# Patient Record
Sex: Male | Born: 1990 | Race: Black or African American | Hispanic: No | Marital: Single | Smoking: Never smoker
Health system: Southern US, Community
[De-identification: ages and names within clinical notes are randomized; demographics above are authoritative.]

## PROBLEM LIST (undated history)

## (undated) DIAGNOSIS — J45909 Unspecified asthma, uncomplicated: Secondary | ICD-10-CM

## (undated) DIAGNOSIS — U071 COVID-19: Secondary | ICD-10-CM

## (undated) HISTORY — PX: OTHER SURGICAL HISTORY: SHX169

---
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
## 2011-01-15 ENCOUNTER — Emergency Department: Payer: Self-pay | Admitting: Emergency Medicine

## 2011-11-25 ENCOUNTER — Emergency Department: Payer: Self-pay | Admitting: Emergency Medicine

## 2014-10-13 ENCOUNTER — Encounter: Payer: Self-pay | Admitting: Emergency Medicine

## 2014-10-13 ENCOUNTER — Emergency Department
Admission: EM | Admit: 2014-10-13 | Discharge: 2014-10-13 | Disposition: A | Discharge status: Home / Self Care | Payer: Self-pay | Attending: Emergency Medicine | Admitting: Emergency Medicine

## 2014-10-13 DIAGNOSIS — K4090 Unilateral inguinal hernia, without obstruction or gangrene, not specified as recurrent: Secondary | ICD-10-CM | POA: Insufficient documentation

## 2014-10-13 DIAGNOSIS — X58XXXA Exposure to other specified factors, initial encounter: Secondary | ICD-10-CM | POA: Insufficient documentation

## 2014-10-13 DIAGNOSIS — Y9389 Activity, other specified: Secondary | ICD-10-CM | POA: Insufficient documentation

## 2014-10-13 DIAGNOSIS — S0501XA Injury of conjunctiva and corneal abrasion without foreign body, right eye, initial encounter: Secondary | ICD-10-CM | POA: Insufficient documentation

## 2014-10-13 DIAGNOSIS — Y998 Other external cause status: Secondary | ICD-10-CM | POA: Insufficient documentation

## 2014-10-13 DIAGNOSIS — H109 Unspecified conjunctivitis: Secondary | ICD-10-CM | POA: Insufficient documentation

## 2014-10-13 DIAGNOSIS — Y9289 Other specified places as the place of occurrence of the external cause: Secondary | ICD-10-CM | POA: Insufficient documentation

## 2014-10-13 MED ORDER — ERYTHROMYCIN 5 MG/GM OP OINT: 1.0000 "application " | TOPICAL_OINTMENT | Freq: Four times a day (QID) | OPHTHALMIC | Status: DC

## 2014-10-13 MED ORDER — FLUORESCEIN SODIUM 1 MG OP STRP
1.0000 | ORAL_STRIP | Freq: Once | OPHTHALMIC | Status: AC
  Administered 2014-10-13: 1 via OPHTHALMIC
  Filled 2014-10-13: qty 1

## 2014-10-13 MED ORDER — TETRACAINE HCL 0.5 % OP SOLN
2.0000 [drp] | Freq: Once | OPHTHALMIC | Status: AC
  Administered 2014-10-13: 2 [drp] via OPHTHALMIC
  Filled 2014-10-13: qty 2

## 2014-10-13 NOTE — ED Notes (Signed)
Pt presents with right eye redness and wants to have his hernia checked out.

## 2014-10-13 NOTE — ED Provider Notes (Signed)
[facility] Emergency Department Provider Note  ____________________________________________  Time seen: Approximately 4:52 PM  I have reviewed the triage vital signs and the nursing notes.   HISTORY  Chief Complaint Eye Pain and Hernia   HPI Kurt Barnett is a 24 y.o. male presents to the ER for complaints of right eye redness and irritation. Patient states it feels like something is in his right eye. Patient states that he did not get anything in his right eye, denies foreign bodies or chemicals. Patient denies sick contacts. Patient reports he does not wear glasses or contacts. Patient states that his eye started out as mildly reddened and itchy. Patient states that he then started rubbing and scratching his eyes over several days and then it started to feel like something was in his eye. Patient states that the right eye slightly sensitive to light. Denies vision changes. Reports that his eye has been intermittently matted shut with greenish discharge. Patient states that it is a burning discomfort. Denies pain in the eye. Denies other complaints. Denies other recent sickness.  She also reports that while he is here he would like to have his left scrotal hernia checked out. Patient states that he is not having any pain in the area and he reports that it is the same as it always has been. Patient reports that he was seen in the ER approximately one year ago for the same. Patient reports that the swelling comes and goes intermittently over the last 2 years. Patient reports again reports that the swelling is unchanged. Patient also reports that he is in no pain at this time. Denies urinary changes or bowel changes. Denies abdominal pain.Reports he was told to follow up with surgery but "just haven't yet". Denies fever.    History reviewed. No pertinent past medical history.  There are no active problems to display for this patient.   History reviewed. No pertinent  past surgical history.  No current outpatient prescriptions on file.  Allergies Review of patient's allergies indicates no known allergies.  No family history on file.  Social History History  Substance Use Topics  . Smoking status: Never Smoker   . Smokeless tobacco: Not on file  . Alcohol Use: No    Review of Systems Constitutional: No fever/chills Eyes: No visual changes. Right eye discomfort.  ENT: No sore throat. Cardiovascular: Denies chest pain. Respiratory: Denies shortness of breath. Gastrointestinal: No abdominal pain.  No nausea, no vomiting.  No diarrhea.  No constipation. Genitourinary: Negative for dysuria. Musculoskeletal: Negative for back pain. Skin: Negative for rash. Neurological: Negative for headaches, focal weakness or numbness.  10-point ROS otherwise negative.  ____________________________________________   PHYSICAL EXAM:  VITAL SIGNS: ED Triage Vitals  Enc Vitals Group     BP 10/13/14 1555 150/52 mmHg     Pulse Rate 10/13/14 1555 107     Resp 10/13/14 1555 18     Temp 10/13/14 1555 98.6 F (37 C)     Temp Source 10/13/14 1555 Oral     SpO2 10/13/14 1555 94 %     Weight 10/13/14 1555 387 lb (175.542 kg)     Height 10/13/14 1555  (1.803 m)     Head Cir --      Peak Flow --      Pain Score --      Pain Loc --      Pain Edu? --      Excl. in GC? --   Blood pressure  122/50, pulse 80, temperature 98.6 F (37 C), temperature source Oral, resp. rate 16, height 5\' 11"  (1.803 m), weight 387 lb (175.542 kg), SpO2 99 %.   Constitutional: Alert and oriented. Well appearing and in no acute distress. Eyes:PERRL. EOMI. Left conjunctiva normal. Right conjunctiva mild to mod injection. No drainage or exudate. Non tender. No swelling or surrounding swelling. NO ecchymosis. Anterior chambers normal as examined.  Head: Atraumatic. Nose: No congestion/rhinnorhea. Ears: no erythema, normal TMs.  Mouth/Throat: Mucous membranes are moist.   Oropharynx non-erythematous. Neck: No stridor.  No cervical spine tenderness to palpation. Hematological/Lymphatic/Immunilogical: No cervical lymphadenopathy. Cardiovascular: Normal rate, regular rhythm. Grossly normal heart sounds.  Good peripheral circulation. Respiratory: Normal respiratory effort.  No retractions. Lungs CTAB. Genitourinary: With [employer] student Mikayla at bedside.  No penile, testicular or inguinal area tenderness. No erythema. No lesions. Left scrotum mildly larger than right, nontender. No hernia palpated. Examined in flat and standing position. Scrotum nontender. Normal coloration bilateral.  Gastrointestinal: Soft and nontender. No distention. No abdominal bruits. No CVA tenderness.normal bowel sounds.  Musculoskeletal: No lower extremity tenderness nor edema.  No joint effusions. Neurologic:  Normal speech and language. No gross focal neurologic deficits are appreciated. No gait instability. Skin:  Skin is warm, dry and intact. No rash noted. Psychiatric: Mood and affect are normal. Speech and behavior are normal.  _Bilateral Distance: 20/70 ; R Distance: 20/70 ; L Distance: 20/40___________________________________________   LABS (all labs ordered are listed, but only abnormal results are displayed)  Labs Reviewed - No data to display ________________________________________   PROCEDURES  Procedure(s) performed: Procedure explained and verbal consent obtained. Right eye examined with your fluroscein. Tetracaine ophthalmic drops used as anesthesia. 2 drops right eye. Patient tolerated well. Patient with right eye corneal abrasion at 6:00. Patient tolerated procedure well.     ____________________________________________   INITIAL IMPRESSION / ASSESSMENT AND PLAN / ED COURSE  Pertinent labs & imaging results that were available during my care of the patient were reviewed by me and considered in my medical decision making (see chart for details).  Very  well appearing. No acute distress. Presents to ER for right eye redness and irritation. Right eye injected and evaluated by fluorescein. Right eye with corneal abrasion. We'll treat with erythromycin ophthalmic ointment. Patient follow-up with ophthalmology in 3 days. Patient verbalized understanding.  Patient also requests to have left scrotal hernia "checked". Left testicle mildly larger than right, nontender. No palpable hernia. NO signs of infection. Unchanged per patient x one year. Discussed in detail with patient to follow up with surgery as previously directed. Per patient this is a chronic condition and unchanged from baseline. Patient to follow up with surgery as previously directed without further evaluation in ER at this time as unchanged from chronic. Discussed strict return parameters. Patient verbalized understanding and agreed to plan.  Encouraged patient established primary care physician for regular follow-up. ____________________________________________   FINAL CLINICAL IMPRESSION(S) / ED DIAGNOSES  Final diagnoses:  Corneal abrasion, right, initial encounter  Conjunctivitis of right eye  Scrotal hernia, left chronic       Renford DillsLindsey Wynona Duhamel, NP 10/13/14 1814  Loleta Roseory Forbach, MD 10/14/14 0004

## 2014-10-13 NOTE — Discharge Instructions (Signed)
Use medication as prescribed. Avoid rubbing and scratching eye. Keep hands clean.  Follow-up with ophthalmology in 3 days. Follow-up with surgery this week regarding your chronic hernia. See above to call tomorrow to schedule. Return to the ER for new or worsening concerns. Return to the ER for scrotal pain, increased swelling, increased pain, abnormal coloration, new or worsening concerns.  Corneal Abrasion The cornea is the clear covering at the front and center of the eye. When looking at the colored portion of the eye (iris), you are looking through the cornea. This very thin tissue is made up of many layers. The surface layer is a single layer of cells (corneal epithelium) and is one of the most sensitive tissues in the body. If a scratch or injury causes the corneal epithelium to come off, it is called a corneal abrasion. If the injury extends to the tissues below the epithelium, the condition is called a corneal ulcer. CAUSES   Scratches.  Trauma.  Foreign body in the eye. Some people have recurrences of abrasions in the area of the original injury even after it has healed (recurrent erosion syndrome). Recurrent erosion syndrome generally improves and goes away with time. SYMPTOMS   Eye pain.  Difficulty or inability to keep the injured eye open.  The eye becomes very sensitive to light.  Recurrent erosions tend to happen suddenly, first thing in the morning, usually after waking up and opening the eye. DIAGNOSIS  Your health care provider can diagnose a corneal abrasion during an eye exam. Dye is usually placed in the eye using a drop or a small paper strip moistened by your tears. When the eye is examined with a special light, the abrasion shows up clearly because of the dye. TREATMENT   Small abrasions may be treated with antibiotic drops or ointment alone.  A pressure patch may be put over the eye. If this is done, follow your doctor's instructions for when to remove the  patch. Do not drive or use machines while the eye patch is on. Judging distances is hard to do with a patch on. If the abrasion becomes infected and spreads to the deeper tissues of the cornea, a corneal ulcer can result. This is serious because it can cause corneal scarring. Corneal scars interfere with light passing through the cornea and cause a loss of vision in the involved eye. HOME CARE INSTRUCTIONS  Use medicine or ointment as directed. Only take over-the-counter or prescription medicines for pain, discomfort, or fever as directed by your health care provider.  Do not drive or operate machinery if your eye is patched. Your ability to judge distances is impaired.  If your health care provider has given you a follow-up appointment, it is very important to keep that appointment. Not keeping the appointment could result in a severe eye infection or permanent loss of vision. If there is any problem keeping the appointment, let your health care provider know. SEEK MEDICAL CARE IF:   You have pain, light sensitivity, and a scratchy feeling in one eye or both eyes.  Your pressure patch keeps loosening up, and you can blink your eye under the patch after treatment.  Any kind of discharge develops from the eye after treatment or if the lids stick together in the morning.  You have the same symptoms in the morning as you did with the original abrasion days, weeks, or months after the abrasion healed. MAKE SURE YOU:   Understand these instructions.  Will watch your condition.  Will get help right away if you are not doing well or get worse. Document Released: 03/11/2000 Document Revised: 03/19/2013 Document Reviewed: 11/19/2012 [facility] Patient Information 2015 [place], [place]. This information is not intended to replace advice given to you by your health care provider. Make sure you discuss any questions you have with your health care provider.  Conjunctivitis Conjunctivitis is commonly  called "pink eye." Conjunctivitis can be caused by bacterial or viral infection, allergies, or injuries. There is usually redness of the lining of the eye, itching, discomfort, and sometimes discharge. There may be deposits of matter along the eyelids. A viral infection usually causes a watery discharge, while a bacterial infection causes a yellowish, thick discharge. Pink eye is very contagious and spreads by direct contact. You may be given antibiotic eyedrops as part of your treatment. Before using your eye medicine, remove all drainage from the eye by washing gently with warm water and cotton balls. Continue to use the medication until you have awakened 2 mornings in a row without discharge from the eye. Do not rub your eye. This increases the irritation and helps spread infection. Use separate towels from other household members. Wash your hands with soap and water before and after touching your eyes. Use cold compresses to reduce pain and sunglasses to relieve irritation from light. Do not wear contact lenses or wear eye makeup until the infection is gone. SEEK MEDICAL CARE IF:   Your symptoms are not better after 3 days of treatment.  You have increased pain or trouble seeing.  The outer eyelids become very red or swollen. Document Released: 04/21/2004 Document Revised: 06/06/2011 Document Reviewed: 03/14/2005 [facility] Patient Information 2015 [place], [place]. This information is not intended to replace advice given to you by your health care provider. Make sure you discuss any questions you have with your health care provider.

## 2014-10-22 ENCOUNTER — Ambulatory Visit: Payer: Self-pay | Admitting: Surgery

## 2015-06-10 ENCOUNTER — Ambulatory Visit: Payer: Self-pay | Admitting: Surgery

## 2017-04-16 ENCOUNTER — Emergency Department
Admission: EM | Admit: 2017-04-16 | Discharge: 2017-04-16 | Disposition: A | Discharge status: Home / Self Care | Payer: Self-pay | Attending: Emergency Medicine | Admitting: Emergency Medicine

## 2017-04-16 ENCOUNTER — Encounter: Payer: Self-pay | Admitting: Emergency Medicine

## 2017-04-16 ENCOUNTER — Other Ambulatory Visit: Payer: Self-pay

## 2017-04-16 DIAGNOSIS — W228XXA Striking against or struck by other objects, initial encounter: Secondary | ICD-10-CM | POA: Insufficient documentation

## 2017-04-16 DIAGNOSIS — S0502XA Injury of conjunctiva and corneal abrasion without foreign body, left eye, initial encounter: Secondary | ICD-10-CM | POA: Insufficient documentation

## 2017-04-16 DIAGNOSIS — Y939 Activity, unspecified: Secondary | ICD-10-CM | POA: Insufficient documentation

## 2017-04-16 DIAGNOSIS — Y92511 Restaurant or cafe as the place of occurrence of the external cause: Secondary | ICD-10-CM | POA: Insufficient documentation

## 2017-04-16 DIAGNOSIS — Y999 Unspecified external cause status: Secondary | ICD-10-CM | POA: Insufficient documentation

## 2017-04-16 MED ORDER — FLUORESCEIN SODIUM 1 MG OP STRP
1.0000 | ORAL_STRIP | Freq: Once | OPHTHALMIC | Status: AC
  Administered 2017-04-16: 1 via OPHTHALMIC
  Filled 2017-04-16: qty 1

## 2017-04-16 MED ORDER — TETRACAINE HCL 0.5 % OP SOLN
1.0000 [drp] | Freq: Once | OPHTHALMIC | Status: AC
  Administered 2017-04-16: 1 [drp] via OPHTHALMIC

## 2017-04-16 MED ORDER — POLYMYXIN B-TRIMETHOPRIM 10000-0.1 UNIT/ML-% OP SOLN: 1.0000 [drp] | OPHTHALMIC | 0 refills | Status: AC

## 2017-04-16 MED ORDER — DICLOFENAC SODIUM 0.1 % OP SOLN: 1.0000 [drp] | Freq: Four times a day (QID) | OPHTHALMIC | 0 refills | Status: AC

## 2017-04-16 MED ORDER — TETRACAINE HCL 0.5 % OP SOLN
OPHTHALMIC | Status: AC
  Administered 2017-04-16: 1 [drp] via OPHTHALMIC
  Filled 2017-04-16: qty 4

## 2017-04-16 NOTE — ED Triage Notes (Signed)
C/O left eye pain -- states he was poked in left eye with a key chain.  Injury occurred last night.

## 2017-04-16 NOTE — ED Provider Notes (Signed)
[facility] Emergency Department Provider Note  ____________________________________________  Time seen: Approximately 7:00 PM  I have reviewed the triage vital signs and the nursing notes.   HISTORY  Chief Complaint Eye Injury    HPI Kurt Barnett is a 27 y.o. male presents to the emergency department with left eye pain after patient reports that he was [place]chain in the [place].  Patient has had some blurry vision, increased tearing and photophobia.  Patient was not wearing contact lenses at the time.  He denies pain with extraocular eye muscle movement.  No nausea or vomiting.  No alleviating measures have been attempted.   History reviewed. No pertinent past medical history.  There are no active problems to display for this patient.   History reviewed. No pertinent surgical history.  Prior to Admission medications   Medication Sig Start Date End Date Taking? Authorizing Provider  diclofenac (VOLTAREN) 0.1 % ophthalmic solution Place 1 drop into the left eye 4 (four) times daily for 7 days. 04/16/17 04/23/17  Orvil Feil, PA-C  erythromycin ophthalmic ointment Place 1 application into the right eye 4 (four) times daily. For five days 10/13/14   Renford Dills, NP  trimethoprim-polymyxin b (POLYTRIM) ophthalmic solution Place 1 drop into the left eye every 4 (four) hours for 10 days. 04/16/17 04/26/17  Orvil Feil, PA-C    Allergies Patient has no known allergies.  No family history on file.  Social History Social History   Tobacco Use  . Smoking status: Never Smoker  . Smokeless tobacco: Never Used  Substance Use Topics  . Alcohol use: No  . Drug use: Not on file     Review of Systems  Constitutional: No fever/chills Eyes: Patient has left eye pain.  ENT: No upper respiratory complaints. Cardiovascular: no chest pain. Respiratory: no cough. No SOB. Musculoskeletal: Negative for musculoskeletal pain. Skin:  Negative for rash, abrasions, lacerations, ecchymosis. Neurological: Negative for headaches, focal weakness or numbness.   ____________________________________________   PHYSICAL EXAM:  VITAL SIGNS: ED Triage Vitals  Enc Vitals Group     BP 04/16/17 1834 (!) 153/98     Pulse Rate 04/16/17 1834 75     Resp 04/16/17 1834 18     Temp 04/16/17 1834 98.5 F (36.9 C)     Temp Source 04/16/17 1834 Oral     SpO2 04/16/17 1834 99 %     Weight 04/16/17 1819 (!) 320 lb (145.2 kg)     Height 04/16/17 1819 6' (1.829 m)     Head Circumference --      Peak Flow --      Pain Score 04/16/17 1819 10     Pain Loc --      Pain Edu? --      Excl. in GC? --      Constitutional: Alert and oriented. Well appearing and in no acute distress. Eyes: Left bulbar conjunctiva is diffusely injected with evidence of chemosis. Pupils are equal round and reactive to light bilaterally.  Extraocular eye muscles intact.  No specific uptake visualized with fluorescein staining. Head: Atraumatic. ENT: Cardiovascular: Normal rate, regular rhythm. Normal S1 and S2.  Good peripheral circulation. Respiratory: Normal respiratory effort without tachypnea or retractions. Lungs CTAB. Good air entry to the bases with no decreased or absent breath sounds. Musculoskeletal: Full range of motion to all extremities. No gross deformities appreciated. Neurologic:  Normal speech and language. No gross focal neurologic deficits are appreciated.  Skin:  Skin is warm, dry and intact. No rash noted.  ____________________________________________   LABS (all labs ordered are listed, but only abnormal results are displayed)  Labs Reviewed - No data to display ____________________________________________  EKG   ____________________________________________  RADIOLOGY   No results found.  ____________________________________________    PROCEDURES  Procedure(s) performed:    Procedures  No uptake visualized with  fluorescein staining.   Tonometry: 23, 23, 24  Medications  fluorescein ophthalmic strip 1 strip (not administered)  tetracaine (PONTOCAINE) 0.5 % ophthalmic solution 1 drop (not administered)     ____________________________________________   INITIAL IMPRESSION / ASSESSMENT AND PLAN / ED COURSE  Pertinent labs & imaging results that were available during my care of the patient were reviewed by me and considered in my medical decision making (see chart for details).  Review of the [place] CSRS was performed in accordance of the [facility] prior to dispensing any controlled drugs.     Assessment and plan Left eye pain Differential diagnosis includes corneal abrasion versus a penetrating globe trauma versus allergic chemosis.  No specific region of uptake was visualized with fluorescein staining.  Patient was treated empirically for a small corneal abrasion despite concrete physical exam findings.  Patient was discharged with Polytrim ophthalmic solution and diclofenac ophthalmic solution.  Patient was referred to ophthalmology.  Vital signs were reassuring prior to discharge.  All patient questions were answered.    ____________________________________________  FINAL CLINICAL IMPRESSION(S) / ED DIAGNOSES  Final diagnoses:  Abrasion of left cornea, initial encounter      NEW MEDICATIONS STARTED DURING THIS VISIT:  ED Discharge Orders        Ordered    trimethoprim-polymyxin b (POLYTRIM) ophthalmic solution  Every 4 hours     04/16/17 1851    diclofenac (VOLTAREN) 0.1 % ophthalmic solution  4 times daily     04/16/17 1851          This chart was dictated using voice recognition software/Dragon. Despite best efforts to proofread, errors can occur which can change the meaning. Any change was purely unintentional.    Orvil FeilWoods, Jaclyn M, PA-C 04/16/17 Elwin [id number]    [place], [place], MD 04/19/17 [phone]

## 2017-07-01 ENCOUNTER — Ambulatory Visit (HOSPITAL_COMMUNITY)
Admission: EM | Admit: 2017-07-01 | Discharge: 2017-07-01 | Disposition: A | Discharge status: Home / Self Care | Payer: Self-pay | Attending: Internal Medicine | Admitting: Internal Medicine

## 2017-07-01 ENCOUNTER — Other Ambulatory Visit: Payer: Self-pay

## 2017-07-01 ENCOUNTER — Encounter (HOSPITAL_COMMUNITY): Payer: Self-pay | Admitting: Emergency Medicine

## 2017-07-01 DIAGNOSIS — S0502XA Injury of conjunctiva and corneal abrasion without foreign body, left eye, initial encounter: Secondary | ICD-10-CM

## 2017-07-01 MED ORDER — ERYTHROMYCIN 5 MG/GM OP OINT: 1.0000 "application " | TOPICAL_OINTMENT | Freq: Four times a day (QID) | OPHTHALMIC | 0 refills | Status: DC

## 2017-07-01 MED ORDER — ERYTHROMYCIN 5 MG/GM OP OINT: 1.0000 "application " | TOPICAL_OINTMENT | Freq: Four times a day (QID) | OPHTHALMIC | 0 refills | Status: AC

## 2017-07-01 MED ORDER — TETRACAINE HCL 0.5 % OP SOLN
2.0000 [drp] | Freq: Once | OPHTHALMIC | Status: AC
  Administered 2017-07-01: 2 [drp] via OPHTHALMIC

## 2017-07-01 NOTE — ED Triage Notes (Signed)
Left eye problem started this morning.  No known injury.  Left eye hurting, watery, and feels like something in left eye.  Vision blurry in left eye.  Patient does not wear contacts or glasses

## 2017-07-01 NOTE — Discharge Instructions (Signed)
Please course of antibiotic ointment. Please call on Monday to follow up with an eye doctor for recheck. If you develop worsening of pain, fevers, vision change or loss please return or go to the Er.

## 2017-07-01 NOTE — ED Provider Notes (Signed)
[facility]    CSN: [id number] Arrival date & time: 07/01/17  1830     History   Chief Complaint Chief Complaint  Patient presents with  . Eye Problem    HPI Kurt Barnett is a 27 y.o. male.   Kurt Barnett presents with complaints of tearing and foreign body sensation to left eye which he woke with this morning. He states his vision is blurry due to tearing. States feels similar to when he had a corneal abrasion in the past. No injury or foreign body expsosure that he knows of. Pain to eye ball that is primarily an irritating sensation. When he holds his lid with his fingers and moves his eye he does not have any eye ball pain. Without mattering or thick discharge. No fevers. Does not use contacts or glasses. Denies URI symptoms, no fevers.    ROS per HPI.      History reviewed. No pertinent past medical history.  There are no active problems to display for this patient.   History reviewed. No pertinent surgical history.     Home Medications    Prior to Admission medications   Medication Sig Start Date End Date Taking? Authorizing Provider  erythromycin ophthalmic ointment Place 1 application into the left eye 4 (four) times daily for 7 days. 07/01/17 07/08/17  Georgetta HaberBurky, Natalie B, NP    Family History Family History  Problem Relation Age of Onset  . Hypertension Mother     Social History Social History   Tobacco Use  . Smoking status: Never Smoker  . Smokeless tobacco: Never Used  Substance Use Topics  . Alcohol use: Yes  . Drug use: Never     Allergies   Patient has no known allergies.   Review of Systems Review of Systems   Physical Exam Triage Vital Signs ED Triage Vitals  Enc Vitals Group     BP 07/01/17 1937 (!) 161/103     Pulse Rate 07/01/17 1937 77     Resp 07/01/17 1937 18     Temp 07/01/17 1937 97.9 F (36.6 C)     Temp Source 07/01/17 1937 Oral     SpO2 07/01/17 1937 98 %     Weight --      Height --      Head  Circumference --      Peak Flow --      Pain Score 07/01/17 1934 8     Pain Loc --      Pain Edu? --      Excl. in GC? --    No data found.  Updated Vital Signs BP (!) 161/103 (BP Location: Right Arm) Comment (BP Location): regular cuff on forearm  Pulse 77   Temp 97.9 F (36.6 C) (Oral)   Resp 18   SpO2 98%   Visual Acuity Right Eye Distance: 20/25 Left Eye Distance: 20/50 Bilateral Distance: 20/25  Right Eye Near:   Left Eye Near:    Bilateral Near:     Physical Exam  Constitutional: He is oriented to person, place, and time. He appears well-developed and well-nourished.  Eyes: Pupils are equal, round, and reactive to light. EOM and lids are normal. Left eye exhibits discharge. Left conjunctiva is injected. Left conjunctiva has no hemorrhage.  Slit lamp exam:      The left eye shows corneal abrasion.    Left eye with significant tearing noted; significant improvement of pain s/p tetracaine; reuptake of fluoresceine as noted on graphic; bilateral eyes  with mild injection noted   Cardiovascular: Normal rate and regular rhythm.  Pulmonary/Chest: Effort normal and breath sounds normal.  Neurological: He is alert and oriented to person, place, and time.  Skin: Skin is warm and dry.     UC Treatments / Results  Labs (all labs ordered are listed, but only abnormal results are displayed) Labs Reviewed - No data to display  EKG None Radiology No results found.  Procedures Procedures (including critical care time)  Medications Ordered in UC Medications  tetracaine (PONTOCAINE) 0.5 % ophthalmic solution 2 drop (2 drops Left Eye Given 07/01/17 1954)     Initial Impression / Assessment and Plan / UC Course  I have reviewed the triage vital signs and the nursing notes.  Pertinent labs & imaging results that were available during my care of the patient were reviewed by me and considered in my medical decision making (see chart for details).     Significant  improvement of acuity after tetracaine placed. Abrasion noted on exam. Erythromycin prescribed. Return precautions provided and discussed at length. Encouraged close follow up with eye doctor for recheck. Patient verbalized understanding and agreeable to plan.    Final Clinical Impressions(s) / UC Diagnoses   Final diagnoses:  Abrasion of left cornea, initial encounter    ED Discharge Orders        Ordered    erythromycin ophthalmic ointment  4 times daily,   Status:  Discontinued     07/01/17 2004    erythromycin ophthalmic ointment  4 times daily     07/01/17 2006       Controlled Substance Prescriptions [place] Controlled Substance Registry consulted? Not Applicable   Georgetta Haber, NP 07/01/17 2009

## 2017-08-05 ENCOUNTER — Emergency Department (HOSPITAL_COMMUNITY): Payer: Self-pay

## 2017-08-05 ENCOUNTER — Encounter (HOSPITAL_COMMUNITY): Payer: Self-pay

## 2017-08-05 ENCOUNTER — Emergency Department (HOSPITAL_COMMUNITY): Payer: Self-pay | Admitting: Certified Registered"

## 2017-08-05 ENCOUNTER — Other Ambulatory Visit: Payer: Self-pay

## 2017-08-05 ENCOUNTER — Encounter (HOSPITAL_COMMUNITY)
Admission: EM | Disposition: A | Discharge status: Home / Self Care | Payer: Self-pay | Source: Home / Self Care | Attending: Emergency Medicine

## 2017-08-05 ENCOUNTER — Observation Stay (HOSPITAL_COMMUNITY)
Admission: EM | Admit: 2017-08-05 | Discharge: 2017-08-06 | Disposition: A | Discharge status: Home / Self Care | Payer: Self-pay | Attending: General Surgery | Admitting: General Surgery

## 2017-08-05 DIAGNOSIS — J45909 Unspecified asthma, uncomplicated: Secondary | ICD-10-CM | POA: Insufficient documentation

## 2017-08-05 DIAGNOSIS — S71131A Puncture wound without foreign body, right thigh, initial encounter: Principal | ICD-10-CM | POA: Insufficient documentation

## 2017-08-05 DIAGNOSIS — S71101A Unspecified open wound, right thigh, initial encounter: Secondary | ICD-10-CM | POA: Insufficient documentation

## 2017-08-05 DIAGNOSIS — W3400XA Accidental discharge from unspecified firearms or gun, initial encounter: Secondary | ICD-10-CM

## 2017-08-05 DIAGNOSIS — Z6841 Body Mass Index (BMI) 40.0 and over, adult: Secondary | ICD-10-CM | POA: Insufficient documentation

## 2017-08-05 HISTORY — PX: I & D EXTREMITY: SHX5045

## 2017-08-05 HISTORY — DX: Unspecified asthma, uncomplicated: J45.909

## 2017-08-05 LAB — TYPE AND SCREEN
UNIT DIVISION: 0
Unit division: 0

## 2017-08-05 LAB — BPAM FFP
BLOOD PRODUCT EXPIRATION DATE: 201905122359
BLOOD PRODUCT EXPIRATION DATE: 201905252359
BLOOD PRODUCT EXPIRATION DATE: 201906022359
Blood Product Expiration Date: 201905122359
ISSUE DATE / TIME: 201905110329
ISSUE DATE / TIME: 201905110329
ISSUE DATE / TIME: 201905051828
Unit Type and Rh: 6200
Unit Type and Rh: 6200
Unit Type and Rh: 6200
Unit Type and Rh: 6200

## 2017-08-05 LAB — PREPARE FRESH FROZEN PLASMA
Unit division: 0
Unit division: 0

## 2017-08-05 LAB — CREATININE, SERUM
Creatinine, Ser: 0.84 mg/dL (ref 0.61–1.24)
GFR calc Af Amer: >60 mL/min (ref >60)
GFR calc non Af Amer: >60 mL/min (ref >60)

## 2017-08-05 LAB — BPAM RBC
BLOOD PRODUCT EXPIRATION DATE: 201905222359
BLOOD PRODUCT EXPIRATION DATE: 201905222359
ISSUE DATE / TIME: 201905110324
ISSUE DATE / TIME: 201905110324
UNIT TYPE AND RH: 9500
UNIT TYPE AND RH: 9500

## 2017-08-05 LAB — I-STAT CHEM 8, ED
BUN: 13 mg/dL (ref 6–20)
Calcium, Ion: 1.15 mmol/L (ref 1.15–1.40)
Chloride: 101 mmol/L (ref 101–111)
Creatinine, Ser: 0.9 mg/dL (ref 0.61–1.24)
GLUCOSE: 117 mg/dL — AB (ref 65–99)
HCT: 45 % (ref 39.0–52.0)
HEMOGLOBIN: 15.3 g/dL (ref 13.0–17.0)
Potassium: 4.5 mmol/L (ref 3.5–5.1)
Sodium: 141 mmol/L (ref 135–145)
TCO2: 30 mmol/L (ref 22–32)

## 2017-08-05 LAB — CBC
HCT: 43.8 % (ref 39.0–52.0)
Hemoglobin: 13.8 g/dL (ref 13.0–17.0)
MCH: 25.5 pg — ABNORMAL LOW (ref 26.0–34.0)
MCHC: 31.5 g/dL (ref 30.0–36.0)
MCV: 81 fL (ref 78.0–100.0)
Platelets: 252 10*3/uL (ref 150–400)
RBC: 5.41 MIL/uL (ref 4.22–5.81)
RDW: 14.7 % (ref 11.5–15.5)
WBC: 9.8 10*3/uL (ref 4.0–10.5)

## 2017-08-05 SURGERY — IRRIGATION AND DEBRIDEMENT EXTREMITY
Anesthesia: General | Site: Thigh | Laterality: Right

## 2017-08-05 MED ORDER — ACETAMINOPHEN 325 MG PO TABS: 650.0000 mg | ORAL_TABLET | ORAL | Status: DC | PRN

## 2017-08-05 MED ORDER — LIDOCAINE HCL (CARDIAC) PF 100 MG/5ML IV SOSY
PREFILLED_SYRINGE | INTRAVENOUS | Status: DC | PRN
  Administered 2017-08-05: 100 mg via INTRATRACHEAL

## 2017-08-05 MED ORDER — MIDAZOLAM HCL 2 MG/2ML IJ SOLN
INTRAMUSCULAR | Status: AC
  Filled 2017-08-05: qty 2

## 2017-08-05 MED ORDER — FENTANYL CITRATE (PF) 250 MCG/5ML IJ SOLN
INTRAMUSCULAR | Status: AC
  Filled 2017-08-05: qty 5

## 2017-08-05 MED ORDER — ROCURONIUM BROMIDE 100 MG/10ML IV SOLN
INTRAVENOUS | Status: DC | PRN
  Administered 2017-08-05: 10 mg via INTRAVENOUS
  Administered 2017-08-05: 20 mg via INTRAVENOUS

## 2017-08-05 MED ORDER — IOPAMIDOL (ISOVUE-370) INJECTION 76%
INTRAVENOUS | Status: AC
  Filled 2017-08-05: qty 100

## 2017-08-05 MED ORDER — ONDANSETRON HCL 4 MG/2ML IJ SOLN: 4.0000 mg | Freq: Four times a day (QID) | INTRAMUSCULAR | Status: DC | PRN

## 2017-08-05 MED ORDER — FENTANYL CITRATE (PF) 250 MCG/5ML IJ SOLN
INTRAMUSCULAR | Status: DC | PRN
  Administered 2017-08-05 (×3): 50 ug via INTRAVENOUS

## 2017-08-05 MED ORDER — 0.9 % SODIUM CHLORIDE (POUR BTL) OPTIME
TOPICAL | Status: DC | PRN
  Administered 2017-08-05: 1000 mL

## 2017-08-05 MED ORDER — SODIUM CHLORIDE 0.9 % IJ SOLN
INTRAMUSCULAR | Status: AC
Start: 2017-08-05 — End: ?
  Filled 2017-08-05: qty 40

## 2017-08-05 MED ORDER — EPHEDRINE SULFATE 50 MG/ML IJ SOLN
INTRAMUSCULAR | Status: AC
  Filled 2017-08-05: qty 2

## 2017-08-05 MED ORDER — MORPHINE SULFATE (PF) 2 MG/ML IV SOLN: 2.0000 mg | INTRAVENOUS | Status: DC | PRN

## 2017-08-05 MED ORDER — PROPOFOL 10 MG/ML IV BOLUS
INTRAVENOUS | Status: AC
  Filled 2017-08-05: qty 20

## 2017-08-05 MED ORDER — SODIUM CHLORIDE 0.9 % IR SOLN
Status: DC | PRN
  Administered 2017-08-05: 3000 mL

## 2017-08-05 MED ORDER — MORPHINE SULFATE (PF) 4 MG/ML IV SOLN
4.0000 mg | INTRAVENOUS | Status: DC | PRN
  Administered 2017-08-05: 4 mg via INTRAVENOUS
  Filled 2017-08-05: qty 1

## 2017-08-05 MED ORDER — SUCCINYLCHOLINE CHLORIDE 200 MG/10ML IV SOSY
PREFILLED_SYRINGE | INTRAVENOUS | Status: AC
  Filled 2017-08-05: qty 10

## 2017-08-05 MED ORDER — LACTATED RINGERS IV SOLN
INTRAVENOUS | Status: DC | PRN
  Administered 2017-08-05: 05:00:00 via INTRAVENOUS

## 2017-08-05 MED ORDER — CEFAZOLIN SODIUM-DEXTROSE 2-3 GM-%(50ML) IV SOLR
INTRAVENOUS | Status: DC | PRN
  Administered 2017-08-05: 2 g via INTRAVENOUS

## 2017-08-05 MED ORDER — MIDAZOLAM HCL 5 MG/5ML IJ SOLN
INTRAMUSCULAR | Status: DC | PRN
  Administered 2017-08-05: 2 mg via INTRAVENOUS

## 2017-08-05 MED ORDER — SUGAMMADEX SODIUM 200 MG/2ML IV SOLN
INTRAVENOUS | Status: AC
  Filled 2017-08-05: qty 2

## 2017-08-05 MED ORDER — MORPHINE SULFATE (PF) 4 MG/ML IV SOLN: 2.0000 mg | INTRAVENOUS | Status: DC | PRN

## 2017-08-05 MED ORDER — SUGAMMADEX SODIUM 500 MG/5ML IV SOLN
INTRAVENOUS | Status: DC | PRN
  Administered 2017-08-05: 350 mg via INTRAVENOUS

## 2017-08-05 MED ORDER — PROMETHAZINE HCL 25 MG/ML IJ SOLN: 6.2500 mg | INTRAMUSCULAR | Status: DC | PRN

## 2017-08-05 MED ORDER — PROPOFOL 10 MG/ML IV BOLUS
INTRAVENOUS | Status: DC | PRN
  Administered 2017-08-05: 200 mg via INTRAVENOUS
  Administered 2017-08-05: 40 mg via INTRAVENOUS

## 2017-08-05 MED ORDER — ENOXAPARIN SODIUM 40 MG/0.4ML ~~LOC~~ SOLN
40.0000 mg | SUBCUTANEOUS | Status: DC
  Administered 2017-08-05: 40 mg via SUBCUTANEOUS
  Filled 2017-08-05: qty 0.4

## 2017-08-05 MED ORDER — FENTANYL CITRATE (PF) 100 MCG/2ML IJ SOLN: 25.0000 ug | INTRAMUSCULAR | Status: DC | PRN

## 2017-08-05 MED ORDER — ONDANSETRON HCL 4 MG/2ML IJ SOLN
INTRAMUSCULAR | Status: DC | PRN
  Administered 2017-08-05: 4 mg via INTRAVENOUS

## 2017-08-05 MED ORDER — ONDANSETRON HCL 4 MG/2ML IJ SOLN
INTRAMUSCULAR | Status: AC
  Filled 2017-08-05: qty 2

## 2017-08-05 MED ORDER — IOPAMIDOL (ISOVUE-370) INJECTION 76%
100.0000 mL | Freq: Once | INTRAVENOUS | Status: AC | PRN
  Administered 2017-08-05: 100 mL via INTRAVENOUS

## 2017-08-05 MED ORDER — PHENYLEPHRINE HCL 10 MG/ML IJ SOLN
INTRAMUSCULAR | Status: DC | PRN
  Administered 2017-08-05: 40 ug via INTRAVENOUS

## 2017-08-05 MED ORDER — OXYCODONE HCL 5 MG PO TABS
5.0000 mg | ORAL_TABLET | ORAL | Status: DC | PRN
  Administered 2017-08-05: 5 mg via ORAL
  Filled 2017-08-05: qty 1

## 2017-08-05 MED ORDER — SUGAMMADEX SODIUM 500 MG/5ML IV SOLN
INTRAVENOUS | Status: AC
  Filled 2017-08-05: qty 5

## 2017-08-05 MED ORDER — SUCCINYLCHOLINE CHLORIDE 20 MG/ML IJ SOLN
INTRAMUSCULAR | Status: DC | PRN
  Administered 2017-08-05: 120 mg via INTRAVENOUS

## 2017-08-05 MED ORDER — ONDANSETRON HCL 4 MG/2ML IJ SOLN
INTRAMUSCULAR | Status: AC
  Filled 2017-08-05: qty 4

## 2017-08-05 MED ORDER — ONDANSETRON 4 MG PO TBDP: 4.0000 mg | ORAL_TABLET | Freq: Four times a day (QID) | ORAL | Status: DC | PRN

## 2017-08-05 SURGICAL SUPPLY — 55 items
BLADE SURG 10 STRL SS (BLADE) IMPLANT
BNDG COHESIVE 4X5 TAN STRL (GAUZE/BANDAGES/DRESSINGS) IMPLANT
BNDG COHESIVE 6X5 TAN STRL LF (GAUZE/BANDAGES/DRESSINGS) ×3 IMPLANT
BNDG GAUZE ELAST 4 BULKY (GAUZE/BANDAGES/DRESSINGS) IMPLANT
COVER SURGICAL LIGHT HANDLE (MISCELLANEOUS) ×3 IMPLANT
DRAPE HIP W/POCKET STRL (DRAPE) IMPLANT
DRAPE ORTHO SPLIT 77X108 STRL (DRAPES) ×4
DRAPE SURG ORHT 6 SPLT 77X108 (DRAPES) ×2 IMPLANT
DRAPE U-SHAPE 47X51 STRL (DRAPES) IMPLANT
DRSG ADAPTIC 3X8 NADH LF (GAUZE/BANDAGES/DRESSINGS) IMPLANT
DURAPREP 26ML APPLICATOR (WOUND CARE) IMPLANT
ELECT CAUTERY BLADE 6.4 (BLADE) ×3 IMPLANT
ELECT REM PT RETURN 9FT ADLT (ELECTROSURGICAL) ×3
ELECTRODE REM PT RTRN 9FT ADLT (ELECTROSURGICAL) ×1 IMPLANT
GAUZE SPONGE 4X4 12PLY STRL (GAUZE/BANDAGES/DRESSINGS) IMPLANT
GAUZE SPONGE 4X4 12PLY STRL LF (GAUZE/BANDAGES/DRESSINGS) ×3 IMPLANT
GLOVE BIO SURGEON STRL SZ 6.5 (GLOVE) ×2 IMPLANT
GLOVE BIO SURGEON STRL SZ8.5 (GLOVE) IMPLANT
GLOVE BIO SURGEONS STRL SZ 6.5 (GLOVE) ×1
GLOVE BIOGEL PI IND STRL 7.0 (GLOVE) ×1 IMPLANT
GLOVE BIOGEL PI IND STRL 8 (GLOVE) ×2 IMPLANT
GLOVE BIOGEL PI IND STRL 8.5 (GLOVE) IMPLANT
GLOVE BIOGEL PI INDICATOR 7.0 (GLOVE) ×2
GLOVE BIOGEL PI INDICATOR 8 (GLOVE) ×4
GLOVE BIOGEL PI INDICATOR 8.5 (GLOVE)
GLOVE SURG SS PI 6.5 STRL IVOR (GLOVE) ×3 IMPLANT
GLOVE SURG SS PI 7.0 STRL IVOR (GLOVE) ×3 IMPLANT
GOWN STRL REUS W/ TWL LRG LVL3 (GOWN DISPOSABLE) ×3 IMPLANT
GOWN STRL REUS W/ TWL XL LVL3 (GOWN DISPOSABLE) IMPLANT
GOWN STRL REUS W/TWL 2XL LVL3 (GOWN DISPOSABLE) ×3 IMPLANT
GOWN STRL REUS W/TWL LRG LVL3 (GOWN DISPOSABLE) ×6
GOWN STRL REUS W/TWL XL LVL3 (GOWN DISPOSABLE)
HANDPIECE INTERPULSE COAX TIP (DISPOSABLE)
KIT BASIN OR (CUSTOM PROCEDURE TRAY) ×3 IMPLANT
KIT TURNOVER KIT B (KITS) ×3 IMPLANT
MANIFOLD NEPTUNE II (INSTRUMENTS) IMPLANT
NS IRRIG 1000ML POUR BTL (IV SOLUTION) ×3 IMPLANT
PACK GENERAL/GYN (CUSTOM PROCEDURE TRAY) ×3 IMPLANT
PACK UNIVERSAL I (CUSTOM PROCEDURE TRAY) IMPLANT
PAD ABD 8X10 STRL (GAUZE/BANDAGES/DRESSINGS) ×3 IMPLANT
PAD ARMBOARD 7.5X6 YLW CONV (MISCELLANEOUS) ×6 IMPLANT
SET HNDPC FAN SPRY TIP SCT (DISPOSABLE) IMPLANT
STAPLER VISISTAT 35W (STAPLE) ×3 IMPLANT
STOCKINETTE IMPERVIOUS 9X36 MD (GAUZE/BANDAGES/DRESSINGS) IMPLANT
STOCKINETTE IMPERVIOUS LG (DRAPES) ×3 IMPLANT
SWAB COLLECTION DEVICE MRSA (MISCELLANEOUS) IMPLANT
SWAB CULTURE ESWAB REG 1ML (MISCELLANEOUS) IMPLANT
TAPE CLOTH SURG 6X10 WHT LF (GAUZE/BANDAGES/DRESSINGS) ×3 IMPLANT
TOWEL OR 17X24 6PK STRL BLUE (TOWEL DISPOSABLE) ×3 IMPLANT
TOWEL OR 17X26 10 PK STRL BLUE (TOWEL DISPOSABLE) IMPLANT
TUBE CONNECTING 12'X1/4 (SUCTIONS)
TUBE CONNECTING 12X1/4 (SUCTIONS) IMPLANT
UNDERPAD 30X30 (UNDERPADS AND DIAPERS) IMPLANT
WATER STERILE IRR 1000ML POUR (IV SOLUTION) IMPLANT
YANKAUER SUCT BULB TIP NO VENT (SUCTIONS) IMPLANT

## 2017-08-05 NOTE — Anesthesia Procedure Notes (Signed)
Procedure Name: Intubation Date/Time: 08/05/2017 5:54 AM Performed by: Claudina Lick, CRNA Pre-anesthesia Checklist: Patient identified, Emergency Drugs available, Suction available, Patient being monitored and Timeout performed Patient Re-evaluated:Patient Re-evaluated prior to induction Oxygen Delivery Method: Circle system utilized Preoxygenation: Pre-oxygenation with 100% oxygen Induction Type: IV induction, Rapid sequence and Cricoid Pressure applied Laryngoscope Size: Miller and 2 Grade View: Grade I Tube type: Oral Tube size: 7.5 mm Number of attempts: 1 Airway Equipment and Method: Stylet Placement Confirmation: ETT inserted through vocal cords under direct vision,  positive ETCO2 and breath sounds checked- equal and bilateral Secured at: 23 cm Tube secured with: Tape Dental Injury: Teeth and Oropharynx as per pre-operative assessment
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
## 2017-08-05 NOTE — Progress Notes (Signed)
   08/05/17 0300  Clinical Encounter Type  Visited With Patient  Visit Type ED  Referral From Nurse  Consult/Referral To Chaplain   Responded to a Level I already in the ED.  Patient had friend at bedside and patient was heading to CT.  Will follow and support as needed. Chaplain Agustin Cree
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
## 2017-08-06 LAB — HIV ANTIBODY (ROUTINE TESTING W REFLEX): HIV Screen 4th Generation wRfx: NONREACTIVE

## 2017-08-06 MED ORDER — OXYCODONE HCL 5 MG PO TABS: 5.0000 mg | ORAL_TABLET | Freq: Four times a day (QID) | ORAL | 0 refills | Status: DC | PRN

## 2017-08-06 MED ORDER — IBUPROFEN 800 MG PO TABS: 800.0000 mg | ORAL_TABLET | Freq: Three times a day (TID) | ORAL | 0 refills | Status: DC | PRN
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
## 2017-08-08 ENCOUNTER — Encounter (HOSPITAL_COMMUNITY): Payer: Self-pay | Admitting: General Surgery

## 2017-08-09 ENCOUNTER — Other Ambulatory Visit: Payer: Self-pay

## 2017-08-09 ENCOUNTER — Encounter (HOSPITAL_COMMUNITY): Payer: Self-pay | Admitting: Emergency Medicine

## 2017-08-09 ENCOUNTER — Emergency Department (HOSPITAL_COMMUNITY)
Admission: EM | Admit: 2017-08-09 | Discharge: 2017-08-09 | Disposition: A | Discharge status: Home / Self Care | Payer: Self-pay | Attending: Emergency Medicine | Admitting: Emergency Medicine

## 2017-08-09 ENCOUNTER — Encounter (HOSPITAL_COMMUNITY): Payer: Self-pay

## 2017-08-09 DIAGNOSIS — Z5189 Encounter for other specified aftercare: Secondary | ICD-10-CM | POA: Insufficient documentation

## 2017-08-09 DIAGNOSIS — S71101D Unspecified open wound, right thigh, subsequent encounter: Secondary | ICD-10-CM | POA: Insufficient documentation

## 2017-08-09 DIAGNOSIS — Z79899 Other long term (current) drug therapy: Secondary | ICD-10-CM | POA: Insufficient documentation

## 2017-08-09 DIAGNOSIS — J45909 Unspecified asthma, uncomplicated: Secondary | ICD-10-CM | POA: Insufficient documentation

## 2017-08-09 NOTE — ED Notes (Signed)
Wound dressed with abd pad and tape. PT given extra supplies per PA of ABD pas, gauze, coban. And kerlix.
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
## 2017-08-09 NOTE — Discharge Instructions (Signed)
Please follow up with your primary care provider within 5-7 days for re-evaluation of your symptoms. If you do not have a primary care provider, information for a healthcare clinic has been provided for you to make arrangements for follow up care.  Please return to the emergency room immediately if you experience any new or worsening symptoms or any symptoms that indicate worsening infection such as fevers, increased redness/swelling/pain, warmth, or drainage from the affected area.    
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
## 2017-08-12 ENCOUNTER — Encounter (HOSPITAL_COMMUNITY): Payer: Self-pay | Admitting: Emergency Medicine

## 2017-08-12 ENCOUNTER — Emergency Department (HOSPITAL_COMMUNITY)
Admission: EM | Admit: 2017-08-12 | Discharge: 2017-08-13 | Disposition: A | Discharge status: Home / Self Care | Payer: Self-pay | Attending: Emergency Medicine | Admitting: Emergency Medicine

## 2017-08-12 DIAGNOSIS — Z5321 Procedure and treatment not carried out due to patient leaving prior to being seen by health care provider: Secondary | ICD-10-CM | POA: Insufficient documentation

## 2017-08-12 DIAGNOSIS — R609 Edema, unspecified: Secondary | ICD-10-CM | POA: Insufficient documentation
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
## 2017-08-13 NOTE — ED Notes (Signed)
Pt. Called x2 for room, no answer.

## 2017-08-16 ENCOUNTER — Emergency Department
Admission: EM | Admit: 2017-08-16 | Discharge: 2017-08-16 | Disposition: A | Discharge status: Home / Self Care | Payer: Self-pay | Attending: Emergency Medicine | Admitting: Emergency Medicine

## 2017-08-16 ENCOUNTER — Other Ambulatory Visit: Payer: Self-pay

## 2017-08-16 DIAGNOSIS — W320XXD Accidental handgun discharge, subsequent encounter: Secondary | ICD-10-CM | POA: Insufficient documentation

## 2017-08-16 DIAGNOSIS — Z5189 Encounter for other specified aftercare: Secondary | ICD-10-CM | POA: Insufficient documentation

## 2017-08-16 DIAGNOSIS — J45909 Unspecified asthma, uncomplicated: Secondary | ICD-10-CM | POA: Insufficient documentation

## 2017-08-16 DIAGNOSIS — S71101D Unspecified open wound, right thigh, subsequent encounter: Secondary | ICD-10-CM | POA: Insufficient documentation

## 2017-08-16 LAB — CBC WITH DIFFERENTIAL/PLATELET
BASOS PCT: 1 %
Basophils Absolute: 0.1 10*3/uL (ref 0–0.1)
Eosinophils Absolute: 0.1 10*3/uL (ref 0–0.7)
Eosinophils Relative: 2 %
HEMATOCRIT: 37.2 % — AB (ref 40.0–52.0)
Hemoglobin: 12.2 g/dL — ABNORMAL LOW (ref 13.0–18.0)
Lymphocytes Relative: 17 %
Lymphs Abs: 1.5 10*3/uL (ref 1.0–3.6)
MCH: 26.2 pg (ref 26.0–34.0)
MCHC: 32.8 g/dL (ref 32.0–36.0)
MCV: 80.1 fL (ref 80.0–100.0)
MONOS PCT: 6 %
Monocytes Absolute: 0.5 10*3/uL (ref 0.2–1.0)
Neutro Abs: 6.5 10*3/uL (ref 1.4–6.5)
Neutrophils Relative %: 74 %
Platelets: 281 10*3/uL (ref 150–440)
RBC: 4.65 MIL/uL (ref 4.40–5.90)
RDW: 14.9 % — ABNORMAL HIGH (ref 11.5–14.5)
WBC: 8.6 10*3/uL (ref 3.8–10.6)

## 2017-08-16 LAB — COMPREHENSIVE METABOLIC PANEL
ALT: 23 U/L (ref 17–63)
AST: 17 U/L (ref 15–41)
Albumin: 3.7 g/dL (ref 3.5–5.0)
Alkaline Phosphatase: 91 U/L (ref 38–126)
Anion gap: 5 (ref 5–15)
BUN: 13 mg/dL (ref 6–20)
CALCIUM: 8.8 mg/dL — AB (ref 8.9–10.3)
CHLORIDE: 108 mmol/L (ref 101–111)
CO2: 29 mmol/L (ref 22–32)
Creatinine, Ser: 0.73 mg/dL (ref 0.61–1.24)
GFR calc Af Amer: >60 mL/min (ref >60)
GFR calc non Af Amer: >60 mL/min (ref >60)
Glucose, Bld: 102 mg/dL — ABNORMAL HIGH (ref 65–99)
POTASSIUM: 3.8 mmol/L (ref 3.5–5.1)
Sodium: 142 mmol/L (ref 135–145)
Total Bilirubin: 0.5 mg/dL (ref 0.3–1.2)
Total Protein: 7.3 g/dL (ref 6.5–8.1)

## 2017-08-16 NOTE — ED Notes (Signed)
Spoke with pt about wait times and what to expect next. Advised pt that I am available for further questions if needed. Also advised pt that he would be going to [facility], and that his wait time shouldn't be much longer.
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
## 2017-08-16 NOTE — Discharge Instructions (Addendum)
Return to the emergency room for yellow pus drainage, fever, redness, a lot of heat around the area, or any other new or worrisome symptoms.  Keep the area clean as you are doing, continue changing the dressings, and follow-up with the wound center in a couple days for recheck.
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
## 2017-08-19 ENCOUNTER — Emergency Department (HOSPITAL_COMMUNITY)
Admission: EM | Admit: 2017-08-19 | Discharge: 2017-08-20 | Disposition: A | Discharge status: Home / Self Care | Payer: Self-pay | Attending: Emergency Medicine | Admitting: Emergency Medicine

## 2017-08-19 ENCOUNTER — Other Ambulatory Visit: Payer: Self-pay

## 2017-08-19 DIAGNOSIS — S71101A Unspecified open wound, right thigh, initial encounter: Secondary | ICD-10-CM | POA: Insufficient documentation

## 2017-08-19 DIAGNOSIS — J45909 Unspecified asthma, uncomplicated: Secondary | ICD-10-CM | POA: Insufficient documentation

## 2017-08-19 DIAGNOSIS — T8131XA Disruption of external operation (surgical) wound, not elsewhere classified, initial encounter: Secondary | ICD-10-CM

## 2017-08-19 DIAGNOSIS — T8133XA Disruption of traumatic injury wound repair, initial encounter: Secondary | ICD-10-CM | POA: Insufficient documentation

## 2017-08-19 DIAGNOSIS — Y929 Unspecified place or not applicable: Secondary | ICD-10-CM | POA: Insufficient documentation

## 2017-08-19 DIAGNOSIS — Z4802 Encounter for removal of sutures: Secondary | ICD-10-CM | POA: Insufficient documentation

## 2017-08-19 DIAGNOSIS — W3400XA Accidental discharge from unspecified firearms or gun, initial encounter: Secondary | ICD-10-CM | POA: Insufficient documentation

## 2017-08-19 DIAGNOSIS — Y939 Activity, unspecified: Secondary | ICD-10-CM | POA: Insufficient documentation

## 2017-08-19 DIAGNOSIS — Y999 Unspecified external cause status: Secondary | ICD-10-CM | POA: Insufficient documentation

## 2017-08-19 DIAGNOSIS — Y829 Unspecified medical devices associated with adverse incidents: Secondary | ICD-10-CM | POA: Insufficient documentation

## 2017-08-19 NOTE — ED Triage Notes (Signed)
Patient is here to have staples removed from posterior/lateral thigh from at [facility] two weeks ago. Wound is still draining.

## 2017-08-20 MED ORDER — CEPHALEXIN 500 MG PO CAPS: 500.0000 mg | ORAL_CAPSULE | Freq: Four times a day (QID) | ORAL | 0 refills | Status: AC

## 2017-08-20 MED ORDER — CEPHALEXIN 250 MG PO CAPS
500.0000 mg | ORAL_CAPSULE | Freq: Once | ORAL | Status: AC
  Administered 2017-08-20: 500 mg via ORAL
  Filled 2017-08-20: qty 2

## 2017-08-20 NOTE — Progress Notes (Signed)
[facility] Progress Note     Subjective: CC: [facility] R thigh Patient came to ED for staple removal from R thigh wound after I&D from [facility]. Wound came apart. Has not been showering. Reports some increased swelling around wound, but no increase in pain. Sensation/motor good.   Objective: Vital signs in last 24 hours: Temp:  [98.4 F (36.9 C)] 98.4 F (36.9 C) (05/25 2249) Pulse Rate:  [65-80] 78 (05/26 0821) Resp:  [15-19] 17 (05/26 0821) BP: (105-165)/(65-117) 138/84 (05/26 1030) SpO2:  [99 %-100 %] 100 % (05/26 0821) Weight:  [149.7 kg (330 lb)] 149.7 kg (330 lb) (05/25 2252)    Intake/Output from previous day: No intake/output data recorded. Intake/Output this shift: No intake/output data recorded.  PE: Gen:  Alert, NAD, pleasant Card:  Regular rate and rhythm, pedal pulses 2+ BL Pulm:  Normal effort, clear to auscultation bilaterally Ext: R medial thigh wound dehiscence with minimal drainage, wound probed and tracts opened, surrounding induration but no erythema; posterior wound without signs of infection Psych: A&Ox3   Lab Results:  No results for input(s): WBC, HGB, HCT, PLT in the last 72 hours. BMET No results for input(s): NA, K, CL, CO2, GLUCOSE, BUN, CREATININE, CALCIUM in the last 72 hours. PT/INR No results for input(s): LABPROT, INR in the last 72 hours. CMP     Component Value Date/Time   NA 142 08/16/2017 1720   K 3.8 08/16/2017 1720   CL 108 08/16/2017 1720   CO2 29 08/16/2017 1720   GLUCOSE 102 (H) 08/16/2017 1720   BUN 13 08/16/2017 1720   CREATININE 0.73 08/16/2017 1720   CALCIUM 8.8 (L) 08/16/2017 1720   PROT 7.3 08/16/2017 1720   ALBUMIN 3.7 08/16/2017 1720   AST 17 08/16/2017 1720   ALT 23 08/16/2017 1720   ALKPHOS 91 08/16/2017 1720   BILITOT 0.5 08/16/2017 1720   GFRNONAA >60 08/16/2017 1720   GFRAA >60 08/16/2017 1720   Lipase  No results found for: LIPASE     Studies/Results: No results  found.  Anti-infectives: Anti-infectives (From admission, onward)   None       Assessment/Plan [facility] R thigh - s/p I&D 5/11 [facility] - wound dehiscence, staples completely removed - probed wound and was able to open up a few pockets - instructed patient on WTD dressing BID and to get in shower and allow soap/water to rinse wound prior to dressing changes - induration surrounding wound, without erythema or increased pain, only scant thin drainage expressed, not grossly infected - recommend discharge from ED on 7d keflex - follow up in trauma clinic for wound check 5/28  LOS: 0 days    Wells Guiles , [facility] Surgery 08/20/2017, 11:30 AM Pager: [phone] Trauma Pager: [phone] Mon-Fri 7:00 am-4:30 pm Sat-Sun 7:00 am-11:30 am

## 2017-08-20 NOTE — Discharge Instructions (Addendum)
Please read your wound care instructions below. Take the antibiotic, cephalexin/Keflex, 4 times per day until it is gone. Attend your appointment on Tuesday morning at the surgery clinic for wound recheck.  WOUND CARE: - dressing to be changed twice daily - supplies: sterile saline, kerlix, scissors, ABD (abdominal) pads, tape  - remove dressing and all packing carefully, moistening with sterile saline as needed to avoid packing/internal dressing sticking to the wound. - clean edges of skin around the wound with water/gauze, making sure there is no tape debris or leakage left on skin that could cause skin irritation or breakdown. - dampen and clean kerlix with sterile saline and pack wound from wound base to skin level, making sure to take note of any possible areas of wound tracking, tunneling and packing appropriately. Wound can be packed loosely. Trim kerlix to size if a whole kerlix is not required. - cover wound with a dry ABD (abdominal) pad and secure with tape.  - write the date/time on the dry dressing/tape to better track when the last dressing change occurred. - change dressing as needed if leakage occurs, wound gets contaminated, or you take a shower - You may shower daily with wound open and following the shower the wound should be dried and a clean dressing placed.
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
## 2018-01-10 ENCOUNTER — Encounter (HOSPITAL_COMMUNITY): Payer: Self-pay | Admitting: Emergency Medicine

## 2018-01-10 ENCOUNTER — Emergency Department (HOSPITAL_COMMUNITY)
Admission: EM | Admit: 2018-01-10 | Discharge: 2018-01-10 | Disposition: A | Discharge status: Home / Self Care | Payer: Self-pay | Attending: Emergency Medicine | Admitting: Emergency Medicine

## 2018-01-10 ENCOUNTER — Other Ambulatory Visit: Payer: Self-pay

## 2018-01-10 DIAGNOSIS — J45909 Unspecified asthma, uncomplicated: Secondary | ICD-10-CM | POA: Insufficient documentation

## 2018-01-10 DIAGNOSIS — H1032 Unspecified acute conjunctivitis, left eye: Secondary | ICD-10-CM | POA: Insufficient documentation

## 2018-01-10 DIAGNOSIS — Z79899 Other long term (current) drug therapy: Secondary | ICD-10-CM | POA: Insufficient documentation

## 2018-01-10 MED ORDER — HYDROCODONE-ACETAMINOPHEN 5-325 MG PO TABS
2.0000 | ORAL_TABLET | Freq: Once | ORAL | Status: AC
  Administered 2018-01-10: 2 via ORAL
  Filled 2018-01-10: qty 2

## 2018-01-10 MED ORDER — FLUORESCEIN SODIUM 1 MG OP STRP
1.0000 | ORAL_STRIP | Freq: Once | OPHTHALMIC | Status: AC
  Administered 2018-01-10: 1 via OPHTHALMIC
  Filled 2018-01-10: qty 1

## 2018-01-10 MED ORDER — TOBRAMYCIN 0.3 % OP SOLN: 2.0000 [drp] | OPHTHALMIC | 0 refills | Status: DC
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
## 2018-01-10 NOTE — ED Triage Notes (Signed)
L eye irritation, redness, blurred vision, and pain since waking up this morning.

## 2018-10-03 ENCOUNTER — Other Ambulatory Visit: Payer: Self-pay

## 2018-10-03 ENCOUNTER — Encounter (HOSPITAL_COMMUNITY): Payer: Self-pay

## 2018-10-03 DIAGNOSIS — R05 Cough: Secondary | ICD-10-CM | POA: Diagnosis present

## 2018-10-03 DIAGNOSIS — J069 Acute upper respiratory infection, unspecified: Secondary | ICD-10-CM | POA: Diagnosis not present

## 2018-10-03 DIAGNOSIS — U071 COVID-19: Secondary | ICD-10-CM | POA: Insufficient documentation

## 2018-10-03 DIAGNOSIS — R0981 Nasal congestion: Secondary | ICD-10-CM | POA: Diagnosis not present

## 2018-10-03 DIAGNOSIS — Z7189 Other specified counseling: Secondary | ICD-10-CM | POA: Diagnosis not present

## 2018-10-03 DIAGNOSIS — J45909 Unspecified asthma, uncomplicated: Secondary | ICD-10-CM | POA: Diagnosis not present

## 2018-10-03 NOTE — ED Triage Notes (Signed)
Pt arrived with complaints of a cough that started yesterday, pt states that he feels like it is his normal allergy symptoms but wants to be sure and get tested for Covid-19. Denies being around anyone sick but has been around kids and works for [employer].

## 2018-10-04 ENCOUNTER — Emergency Department (HOSPITAL_COMMUNITY)
Admission: EM | Admit: 2018-10-04 | Discharge: 2018-10-04 | Disposition: A | Discharge status: Home / Self Care | Payer: HRSA Program | Attending: Emergency Medicine | Admitting: Emergency Medicine

## 2018-10-04 DIAGNOSIS — J069 Acute upper respiratory infection, unspecified: Secondary | ICD-10-CM

## 2018-10-04 DIAGNOSIS — Z7189 Other specified counseling: Secondary | ICD-10-CM

## 2018-10-04 MED ORDER — ALBUTEROL SULFATE HFA 108 (90 BASE) MCG/ACT IN AERS
2.0000 | INHALATION_SPRAY | Freq: Once | RESPIRATORY_TRACT | Status: AC
  Administered 2018-10-04: 2 via RESPIRATORY_TRACT
  Filled 2018-10-04: qty 6.7

## 2018-10-04 NOTE — ED Notes (Signed)
Bed: WTR7 Expected date:  Expected time:  Means of arrival:  Comments: 

## 2018-10-04 NOTE — Discharge Instructions (Signed)
We recommend use of daily Zyrtec or Claritin for management of congestion and postnasal drip.  Use 1 to 2 puffs of an albuterol inhaler every 6 hours for management of wheezing, shortness of breath, chest tightness.  You have a COVID test pending which should result in approximately 48 hours.  If this test is positive, you should continue quarantine for a total of 14 days.  Return to the ED for new or concerning symptoms.
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
## 2018-10-06 LAB — NOVEL CORONAVIRUS, NAA (HOSP ORDER, SEND-OUT TO REF LAB; TAT 18-24 HRS): SARS-CoV-2, NAA: DETECTED — AB

## 2018-12-02 ENCOUNTER — Emergency Department (HOSPITAL_COMMUNITY)
Admission: EM | Admit: 2018-12-02 | Discharge: 2018-12-03 | Disposition: A | Discharge status: Home / Self Care | Payer: Self-pay | Attending: Emergency Medicine | Admitting: Emergency Medicine

## 2018-12-02 ENCOUNTER — Encounter (HOSPITAL_COMMUNITY): Payer: Self-pay | Admitting: Emergency Medicine

## 2018-12-02 ENCOUNTER — Other Ambulatory Visit: Payer: Self-pay

## 2018-12-02 DIAGNOSIS — Z79899 Other long term (current) drug therapy: Secondary | ICD-10-CM | POA: Insufficient documentation

## 2018-12-02 DIAGNOSIS — J45909 Unspecified asthma, uncomplicated: Secondary | ICD-10-CM | POA: Insufficient documentation

## 2018-12-02 DIAGNOSIS — K4090 Unilateral inguinal hernia, without obstruction or gangrene, not specified as recurrent: Secondary | ICD-10-CM | POA: Insufficient documentation

## 2018-12-02 HISTORY — DX: COVID-19: U07.1

## 2018-12-02 NOTE — ED Triage Notes (Signed)
Reports inguinal hernia since 2012.  States he was supposed to have surgery but never did.  Reports it has increased in size over the last month.  Denies pain.

## 2018-12-03 ENCOUNTER — Emergency Department (HOSPITAL_COMMUNITY): Payer: Self-pay

## 2018-12-03 NOTE — ED Notes (Signed)
Patient transported to Ultrasound 
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
## 2018-12-03 NOTE — Discharge Instructions (Signed)
Thank you for allowing me to care for you today in the Emergency Department.   Call to schedule a follow-up appointment with Dr. Rosendo Gros.  Return to the emergency department if you develop fevers, chills, pain to the groin or scrotum, if the area gets red, hot to the touch, or if there starts to be thick, mucus-like drainage, or if you develop difficulty peeing or pooping, or testicular pain.

## 2019-09-18 ENCOUNTER — Emergency Department (HOSPITAL_COMMUNITY)
Admission: EM | Admit: 2019-09-18 | Discharge: 2019-09-19 | Disposition: A | Discharge status: Home / Self Care | Payer: Self-pay | Attending: Emergency Medicine | Admitting: Emergency Medicine

## 2019-09-18 ENCOUNTER — Encounter (HOSPITAL_COMMUNITY): Payer: Self-pay | Admitting: Emergency Medicine

## 2019-09-18 ENCOUNTER — Other Ambulatory Visit: Payer: Self-pay

## 2019-09-18 DIAGNOSIS — Z5321 Procedure and treatment not carried out due to patient leaving prior to being seen by health care provider: Secondary | ICD-10-CM | POA: Insufficient documentation

## 2019-09-18 DIAGNOSIS — K4090 Unilateral inguinal hernia, without obstruction or gangrene, not specified as recurrent: Secondary | ICD-10-CM | POA: Insufficient documentation

## 2019-09-18 LAB — CBC
HCT: 44.5 % (ref 39.0–52.0)
Hemoglobin: 13.4 g/dL (ref 13.0–17.0)
MCH: 25.2 pg — ABNORMAL LOW (ref 26.0–34.0)
MCHC: 30.1 g/dL (ref 30.0–36.0)
MCV: 83.6 fL (ref 80.0–100.0)
Platelets: 223 10*3/uL (ref 150–400)
RBC: 5.32 MIL/uL (ref 4.22–5.81)
RDW: 14.7 % (ref 11.5–15.5)
WBC: 7.5 10*3/uL (ref 4.0–10.5)
nRBC: 0 % (ref 0.0–0.2)

## 2019-09-18 LAB — BASIC METABOLIC PANEL
Anion gap: 9 (ref 5–15)
BUN: 7 mg/dL (ref 6–20)
CO2: 27 mmol/L (ref 22–32)
Calcium: 8.8 mg/dL — ABNORMAL LOW (ref 8.9–10.3)
Chloride: 106 mmol/L (ref 98–111)
Creatinine, Ser: 0.74 mg/dL (ref 0.61–1.24)
GFR calc Af Amer: >60 mL/min (ref >60)
GFR calc non Af Amer: >60 mL/min (ref >60)
Glucose, Bld: 105 mg/dL — ABNORMAL HIGH (ref 70–99)
Potassium: 4.1 mmol/L (ref 3.5–5.1)
Sodium: 142 mmol/L (ref 135–145)

## 2019-09-18 NOTE — ED Triage Notes (Signed)
Patient arrives to ED with complaints of worsening of his inguinal hernia. Patient states that he has had the hernia since 2012 and now it is bigger than a grapefruit. Had a scrotal US on 9/6 and was supposed to follow up with general surgery but did not.

## 2019-09-19 NOTE — ED Notes (Signed)
Pt did not respond when called for vitals check 

## 2019-09-19 NOTE — ED Notes (Signed)
Pt was called for vitals recheck and did not respond

## 2020-01-21 ENCOUNTER — Other Ambulatory Visit: Payer: Self-pay

## 2020-01-21 ENCOUNTER — Encounter (HOSPITAL_COMMUNITY): Payer: Self-pay | Admitting: Urgent Care

## 2020-01-21 ENCOUNTER — Ambulatory Visit (HOSPITAL_COMMUNITY)
Admission: EM | Admit: 2020-01-21 | Discharge: 2020-01-21 | Disposition: A | Discharge status: Home / Self Care | Payer: Self-pay | Attending: Urgent Care | Admitting: Urgent Care

## 2020-01-21 DIAGNOSIS — M5412 Radiculopathy, cervical region: Secondary | ICD-10-CM

## 2020-01-21 DIAGNOSIS — M62838 Other muscle spasm: Secondary | ICD-10-CM

## 2020-01-21 DIAGNOSIS — M542 Cervicalgia: Secondary | ICD-10-CM

## 2020-01-21 MED ORDER — TIZANIDINE HCL 4 MG PO TABS: 4.0000 mg | ORAL_TABLET | Freq: Three times a day (TID) | ORAL | 0 refills | Status: DC | PRN

## 2020-01-21 MED ORDER — PREDNISONE 20 MG PO TABS: ORAL_TABLET | ORAL | 0 refills | Status: DC
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
## 2020-01-21 NOTE — ED Triage Notes (Signed)
Pt presnets with right shoulder pain and right arm pain x 1 week. States he slept over his right side for too long and started developing the pain. Reports having intermittent tingling sensation in the hand.

## 2020-07-26 ENCOUNTER — Emergency Department (HOSPITAL_BASED_OUTPATIENT_CLINIC_OR_DEPARTMENT_OTHER): Payer: Self-pay

## 2020-07-26 ENCOUNTER — Emergency Department (HOSPITAL_BASED_OUTPATIENT_CLINIC_OR_DEPARTMENT_OTHER)
Admission: EM | Admit: 2020-07-26 | Discharge: 2020-07-26 | Disposition: A | Discharge status: Home / Self Care | Payer: Self-pay | Attending: Emergency Medicine | Admitting: Emergency Medicine

## 2020-07-26 ENCOUNTER — Encounter (HOSPITAL_BASED_OUTPATIENT_CLINIC_OR_DEPARTMENT_OTHER): Payer: Self-pay | Admitting: Emergency Medicine

## 2020-07-26 ENCOUNTER — Other Ambulatory Visit: Payer: Self-pay

## 2020-07-26 DIAGNOSIS — J45909 Unspecified asthma, uncomplicated: Secondary | ICD-10-CM | POA: Insufficient documentation

## 2020-07-26 DIAGNOSIS — Z8616 Personal history of COVID-19: Secondary | ICD-10-CM | POA: Insufficient documentation

## 2020-07-26 DIAGNOSIS — S99912A Unspecified injury of left ankle, initial encounter: Secondary | ICD-10-CM | POA: Insufficient documentation

## 2020-07-26 DIAGNOSIS — M79605 Pain in left leg: Secondary | ICD-10-CM

## 2020-07-26 DIAGNOSIS — S99922A Unspecified injury of left foot, initial encounter: Secondary | ICD-10-CM | POA: Insufficient documentation

## 2020-07-26 DIAGNOSIS — W52XXXA Crushed, pushed or stepped on by crowd or human stampede, initial encounter: Secondary | ICD-10-CM | POA: Insufficient documentation

## 2020-07-26 DIAGNOSIS — S8992XA Unspecified injury of left lower leg, initial encounter: Secondary | ICD-10-CM | POA: Insufficient documentation

## 2020-07-26 MED ORDER — NAPROXEN 375 MG PO TABS: 375.0000 mg | ORAL_TABLET | Freq: Two times a day (BID) | ORAL | 0 refills | Status: DC

## 2020-07-26 MED ORDER — LIDOCAINE 5 % EX PTCH: 1.0000 | MEDICATED_PATCH | CUTANEOUS | 0 refills | Status: DC

## 2020-07-26 MED ORDER — KETOROLAC TROMETHAMINE 60 MG/2ML IM SOLN
60.0000 mg | Freq: Once | INTRAMUSCULAR | Status: AC
  Administered 2020-07-26: 60 mg via INTRAMUSCULAR
  Filled 2020-07-26: qty 2

## 2020-07-26 MED ORDER — LIDOCAINE 5 % EX PTCH
2.0000 | MEDICATED_PATCH | CUTANEOUS | Status: DC
  Administered 2020-07-26: 2 via TRANSDERMAL
  Filled 2020-07-26: qty 2
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
## 2021-12-28 DIAGNOSIS — N50812 Left testicular pain: Secondary | ICD-10-CM | POA: Diagnosis not present

## 2021-12-28 DIAGNOSIS — Z6841 Body Mass Index (BMI) 40.0 and over, adult: Secondary | ICD-10-CM | POA: Diagnosis not present

## 2021-12-28 DIAGNOSIS — Z114 Encounter for screening for human immunodeficiency virus [HIV]: Secondary | ICD-10-CM | POA: Diagnosis not present

## 2021-12-28 DIAGNOSIS — R69 Illness, unspecified: Secondary | ICD-10-CM | POA: Diagnosis not present

## 2021-12-28 DIAGNOSIS — N5089 Other specified disorders of the male genital organs: Secondary | ICD-10-CM | POA: Diagnosis not present

## 2021-12-28 DIAGNOSIS — Z0000 Encounter for general adult medical examination without abnormal findings: Secondary | ICD-10-CM | POA: Diagnosis not present

## 2021-12-28 DIAGNOSIS — K4090 Unilateral inguinal hernia, without obstruction or gangrene, not specified as recurrent: Secondary | ICD-10-CM | POA: Diagnosis not present

## 2021-12-30 ENCOUNTER — Other Ambulatory Visit: Payer: Self-pay | Admitting: Family Medicine

## 2021-12-30 DIAGNOSIS — K4090 Unilateral inguinal hernia, without obstruction or gangrene, not specified as recurrent: Secondary | ICD-10-CM

## 2021-12-30 DIAGNOSIS — N50812 Left testicular pain: Secondary | ICD-10-CM

## 2021-12-30 DIAGNOSIS — N5089 Other specified disorders of the male genital organs: Secondary | ICD-10-CM

## 2022-01-04 ENCOUNTER — Other Ambulatory Visit: Payer: Self-pay
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
## 2022-04-20 ENCOUNTER — Emergency Department: Payer: 59

## 2022-04-20 ENCOUNTER — Encounter: Payer: Self-pay | Admitting: Emergency Medicine

## 2022-04-20 ENCOUNTER — Other Ambulatory Visit: Payer: Self-pay

## 2022-04-20 ENCOUNTER — Emergency Department
Admission: EM | Admit: 2022-04-20 | Discharge: 2022-04-20 | Disposition: A | Discharge status: Home / Self Care | Payer: 59 | Attending: Emergency Medicine | Admitting: Emergency Medicine

## 2022-04-20 DIAGNOSIS — K4091 Unilateral inguinal hernia, without obstruction or gangrene, recurrent: Secondary | ICD-10-CM | POA: Diagnosis not present

## 2022-04-20 DIAGNOSIS — K4090 Unilateral inguinal hernia, without obstruction or gangrene, not specified as recurrent: Secondary | ICD-10-CM | POA: Diagnosis not present

## 2022-04-20 DIAGNOSIS — Z87438 Personal history of other diseases of male genital organs: Secondary | ICD-10-CM | POA: Diagnosis not present

## 2022-04-20 DIAGNOSIS — I861 Scrotal varices: Secondary | ICD-10-CM | POA: Diagnosis not present

## 2022-04-20 DIAGNOSIS — J45909 Unspecified asthma, uncomplicated: Secondary | ICD-10-CM | POA: Diagnosis not present

## 2022-04-20 DIAGNOSIS — Z8719 Personal history of other diseases of the digestive system: Secondary | ICD-10-CM | POA: Diagnosis not present

## 2022-04-20 DIAGNOSIS — N5082 Scrotal pain: Secondary | ICD-10-CM | POA: Diagnosis not present

## 2022-04-20 MED ORDER — IBUPROFEN 600 MG PO TABS: 600.0000 mg | ORAL_TABLET | Freq: Four times a day (QID) | ORAL | 0 refills | Status: DC | PRN

## 2022-04-20 MED ORDER — IBUPROFEN 600 MG PO TABS
600.0000 mg | ORAL_TABLET | Freq: Once | ORAL | Status: AC
  Administered 2022-04-20: 600 mg via ORAL
  Filled 2022-04-20: qty 1

## 2022-04-20 NOTE — Discharge Instructions (Addendum)
You have both a varicocele which is swollen blood vessels in your scrotum as well as an inguinal hernia which is the swelling in your groin.  At this time there are no signs of any obstruction or complication.  Take the ibuprofen as needed for pain.  It may be helpful to follow-up with a urologist which is a specialist in male with genital issues.  We have provided a referral.  Return to the ER for new, worsening, or persistent severe testicular or groin pain, swelling, difficulty urinating, nausea or vomiting, abdominal pain, or any other new or worsening symptoms that concern you.

## 2022-04-20 NOTE — ED Provider Notes (Signed)
[facility] Provider Note    Event Date/Time   First MD Initiated Contact with Patient 04/20/22 [phone]     (approximate)   History   Hernia   HPI  Kurt Barnett is a 32 y.o. male with a history of asthma who presents with scrotal pain.  The patient states that he has chronic scrotal swelling due to a hernia.  He has pain intermittently.  The patient states that he has had increased pain over the last day in the left scrotum.  However, he denies increased swelling or size of the hernia.  He has no dysuria or difficulty urinating.  He has no change in his bowel movements.  He denies any abdominal pain.  The patient also has a chronic bulge in his left inguinal area which she states is unchanged today and is not acutely painful.  I reviewed the past medical records.  The patient's most recent outpatient encounter was in October 2023 with primary care.  He was noted to have chronic left testicular swelling at that time.   Physical Exam   Triage Vital Signs: ED Triage Vitals [04/20/22 0109]  Enc Vitals Group     BP (!) 155/50     Pulse Rate 80     Resp 16     Temp 98.6 F (37 C)     Temp Source Oral     SpO2 98 %     Weight (!) 380 lb (172.4 kg)     Height 6' (1.829 m)     Head Circumference      Peak Flow      Pain Score 8     Pain Loc      Pain Edu?      Excl. in [place]?     Most recent vital signs: Vitals:   04/20/22 0109  BP: (!) 155/50  Pulse: 80  Resp: 16  Temp: 98.6 F (37 C)  SpO2: 98%     General: Awake, no distress.  CV:  Good peripheral perfusion.  Resp:  Normal effort.  Abd:  No distention.  Other:  Significant bilateral scrotal swelling worse on the left.  No erythema or induration.  No tenderness or palpable masses.  No palpable inguinal hernia.   ED Results / Procedures / Treatments   Labs (all labs ordered are listed, but only abnormal results are displayed) Labs Reviewed - No data to  display    EKG     RADIOLOGY  US scrotum: I independently viewed and interpreted the images; there is no visible solid mass.  Radiology report indicates unremarkable testes with normal Doppler, a mild varicocele, and findings consistent with a fat-containing left inguinal hernia.   PROCEDURES:  Critical Care performed: No  Procedures   MEDICATIONS ORDERED IN ED: Medications  ibuprofen (ADVIL) tablet 600 mg (has no administration in time range)     IMPRESSION / MDM / ASSESSMENT AND PLAN / ED COURSE  I reviewed the triage vital signs and the nursing notes.  32 year old male with chronic scrotal swelling and pain due to an inguinal hernia presents with increased pain over the last day with no increase in swelling or other acute symptoms.  On exam the patient was asleep when I entered the room and appears very comfortable; he has significant swelling to the scrotum specially on the left side but no tenderness or other concerning findings.  Ultrasound was obtained and shows a fat-containing inguinal hernia and mild varicocele with no acute  findings.  Differential diagnosis includes, but is not limited to, inguinal hernia, varicocele.  There is no evidence of testicular torsion.  The patient has no evidence of epididymitis or orchitis.  There is no clinical evidence for incarceration or strangulation of the hernia or other acute complication.  Patient's presentation is most consistent with exacerbation of chronic illness.  At this time, the patient is stable for discharge.  There is no indication for further ED workup.  The hernia is chronic and does not require reduction in the ED.  Will give ibuprofen for pain.  The patient will follow-up with his PMD and I have also given urology referral.  I gave strict return precautions and the patient expressed understanding.   FINAL CLINICAL IMPRESSION(S) / ED DIAGNOSES   Final diagnoses:  Varicocele  Unilateral recurrent inguinal hernia  without obstruction or gangrene     Rx / DC Orders   ED Discharge Orders          Ordered    ibuprofen (ADVIL) 600 MG tablet  Every 6 hours PRN        04/20/22 0444             Note:  This document was prepared using Dragon voice recognition software and may include unintentional dictation errors.    Arta Silence, MD 04/20/22 [phone]

## 2022-04-20 NOTE — ED Triage Notes (Signed)
Pt to ED from home c/o scrotal hernia he states since highschool.  Says pain is intermittent but tonight worse.  Says every time he comes in to be seen he ends up leaving before seeing a doctor.  Left scrotum swollen, color WNL, denies difficulty urinating.

## 2022-08-29 ENCOUNTER — Encounter: Payer: Self-pay | Admitting: Emergency Medicine

## 2022-08-29 ENCOUNTER — Emergency Department
Admission: EM | Admit: 2022-08-29 | Discharge: 2022-08-29 | Disposition: A | Discharge status: Home / Self Care | Payer: 59 | Attending: Emergency Medicine | Admitting: Emergency Medicine

## 2022-08-29 ENCOUNTER — Other Ambulatory Visit: Payer: Self-pay

## 2022-08-29 DIAGNOSIS — J45909 Unspecified asthma, uncomplicated: Secondary | ICD-10-CM | POA: Insufficient documentation

## 2022-08-29 DIAGNOSIS — R9431 Abnormal electrocardiogram [ECG] [EKG]: Secondary | ICD-10-CM | POA: Diagnosis not present

## 2022-08-29 DIAGNOSIS — M25512 Pain in left shoulder: Secondary | ICD-10-CM | POA: Diagnosis not present

## 2022-08-29 DIAGNOSIS — M62838 Other muscle spasm: Secondary | ICD-10-CM | POA: Insufficient documentation

## 2022-08-29 DIAGNOSIS — M546 Pain in thoracic spine: Secondary | ICD-10-CM | POA: Diagnosis not present

## 2022-08-29 MED ORDER — MELOXICAM 15 MG PO TABS: 15.0000 mg | ORAL_TABLET | Freq: Every day | ORAL | 2 refills | Status: AC

## 2022-08-29 MED ORDER — KETOROLAC TROMETHAMINE 30 MG/ML IJ SOLN
30.0000 mg | Freq: Once | INTRAMUSCULAR | Status: AC
  Administered 2022-08-29: 30 mg via INTRAMUSCULAR
  Filled 2022-08-29: qty 1

## 2022-08-29 MED ORDER — CYCLOBENZAPRINE HCL 10 MG PO TABS: 10.0000 mg | ORAL_TABLET | Freq: Three times a day (TID) | ORAL | 0 refills | Status: AC | PRN

## 2022-08-29 MED ORDER — CYCLOBENZAPRINE HCL 10 MG PO TABS
10.0000 mg | ORAL_TABLET | Freq: Once | ORAL | Status: AC
  Administered 2022-08-29: 10 mg via ORAL
  Filled 2022-08-29: qty 1

## 2022-08-29 NOTE — ED Triage Notes (Signed)
C/O left upper back, neck, shoulder pain x 1 day.  Pain radiates down left side of back with movement.  Denies injury.

## 2022-08-29 NOTE — Discharge Instructions (Signed)
Follow-up with your doctor or orthopedics if not improving in 2 to 3 days.  Apply ice to the neck and shoulder.  Return if worsening.  Take medications as prescribed
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
## 2023-06-10 DIAGNOSIS — K4090 Unilateral inguinal hernia, without obstruction or gangrene, not specified as recurrent: Secondary | ICD-10-CM | POA: Diagnosis not present

## 2023-06-10 DIAGNOSIS — K4091 Unilateral inguinal hernia, without obstruction or gangrene, recurrent: Secondary | ICD-10-CM | POA: Diagnosis not present

## 2023-06-11 DIAGNOSIS — K4091 Unilateral inguinal hernia, without obstruction or gangrene, recurrent: Secondary | ICD-10-CM | POA: Diagnosis not present

## 2023-06-11 DIAGNOSIS — D1779 Benign lipomatous neoplasm of other sites: Secondary | ICD-10-CM | POA: Diagnosis not present

## 2023-06-11 DIAGNOSIS — K4090 Unilateral inguinal hernia, without obstruction or gangrene, not specified as recurrent: Secondary | ICD-10-CM | POA: Diagnosis not present

## 2023-06-11 DIAGNOSIS — K625 Hemorrhage of anus and rectum: Secondary | ICD-10-CM | POA: Diagnosis not present

## 2023-06-11 DIAGNOSIS — K4030 Unilateral inguinal hernia, with obstruction, without gangrene, not specified as recurrent: Secondary | ICD-10-CM | POA: Diagnosis not present

## 2023-06-17 DIAGNOSIS — K4091 Unilateral inguinal hernia, without obstruction or gangrene, recurrent: Secondary | ICD-10-CM | POA: Diagnosis not present

## 2023-06-17 DIAGNOSIS — E669 Obesity, unspecified: Secondary | ICD-10-CM | POA: Diagnosis not present

## 2023-06-17 DIAGNOSIS — R188 Other ascites: Secondary | ICD-10-CM | POA: Diagnosis not present

## 2023-06-17 DIAGNOSIS — K4090 Unilateral inguinal hernia, without obstruction or gangrene, not specified as recurrent: Secondary | ICD-10-CM | POA: Diagnosis not present

## 2023-06-17 DIAGNOSIS — R059 Cough, unspecified: Secondary | ICD-10-CM | POA: Diagnosis not present

## 2023-06-17 DIAGNOSIS — Z6841 Body Mass Index (BMI) 40.0 and over, adult: Secondary | ICD-10-CM | POA: Diagnosis not present

## 2023-06-17 DIAGNOSIS — N5089 Other specified disorders of the male genital organs: Secondary | ICD-10-CM | POA: Diagnosis not present

## 2024-03-12 ENCOUNTER — Other Ambulatory Visit: Payer: Self-pay

## 2024-03-12 ENCOUNTER — Emergency Department
Admission: EM | Admit: 2024-03-12 | Discharge: 2024-03-12 | Disposition: A | Discharge status: Home / Self Care | Attending: Emergency Medicine | Admitting: Emergency Medicine

## 2024-03-12 DIAGNOSIS — S46912A Strain of unspecified muscle, fascia and tendon at shoulder and upper arm level, left arm, initial encounter: Secondary | ICD-10-CM

## 2024-03-12 DIAGNOSIS — Y990 Civilian activity done for income or pay: Secondary | ICD-10-CM | POA: Diagnosis not present

## 2024-03-12 DIAGNOSIS — X503XXA Overexertion from repetitive movements, initial encounter: Secondary | ICD-10-CM | POA: Diagnosis not present

## 2024-03-12 DIAGNOSIS — S4992XA Unspecified injury of left shoulder and upper arm, initial encounter: Secondary | ICD-10-CM | POA: Diagnosis not present

## 2024-03-12 MED ORDER — BACLOFEN 10 MG PO TABS: 10.0000 mg | ORAL_TABLET | Freq: Three times a day (TID) | ORAL | 0 refills | Status: AC

## 2024-03-12 MED ORDER — MELOXICAM 15 MG PO TABS: 15.0000 mg | ORAL_TABLET | Freq: Every day | ORAL | 0 refills | Status: AC

## 2024-03-12 NOTE — Discharge Instructions (Signed)
 Apply ice to the left shoulder.  Take medication as prescribed.  Do not take ibuprofen  with this medication.  Follow-up with orthopedics if not improving in 1 week.  Return if worsening
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
## 2024-03-12 NOTE — ED Triage Notes (Signed)
 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
